# Patient Record
Sex: Male | Born: 2000 | Race: White | Hispanic: No | Marital: Single
Health system: Southern US, Community
[De-identification: ages and names within clinical notes are randomized; demographics above are authoritative.]

---
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
## 2019-08-23 ENCOUNTER — Ambulatory Visit: Payer: Self-pay | Attending: Internal Medicine

## 2019-08-23 DIAGNOSIS — Z23 Encounter for immunization: Secondary | ICD-10-CM

## 2019-08-23 NOTE — Progress Notes (Signed)
   Covid-19 Vaccination Clinic  Name:  Donald Hoffman    MRN: [id number] DOB: Dec 12, 2000  08/23/2019  Donald Hoffman was observed post Covid-19 immunization for 15 minutes without incident. He was provided with Vaccine Information Sheet and instruction to access the V-Safe system.   Donald Hoffman was instructed to call 911 with any severe reactions post vaccine: Marland Kitchen Difficulty breathing  . Swelling of face and throat  . A fast heartbeat  . A bad rash all over body  . Dizziness and weakness   Immunizations Administered    Name Date Dose VIS Date Route   Pfizer COVID-19 Vaccine 08/23/2019  8:48 AM 0.3 mL 05/04/2019 Intramuscular   Manufacturer: [employer], [employer]   Lot: [id number]   NDC: [address]-0

## 2019-09-17 ENCOUNTER — Ambulatory Visit: Payer: Self-pay | Attending: Internal Medicine

## 2019-09-17 DIAGNOSIS — Z23 Encounter for immunization: Secondary | ICD-10-CM

## 2019-09-17 NOTE — Progress Notes (Signed)
   Covid-19 Vaccination Clinic  Name:  ZAVIAN SLOWEY    MRN: [id number] DOB: [birth year]  09/17/2019  Mr. Gobert was observed post Covid-19 immunization for 15 minutes without incident. He was provided with Vaccine Information Sheet and instruction to access the V-Safe system.   Mr. Keirsey was instructed to call 911 with any severe reactions post vaccine: Marland Kitchen Difficulty breathing  . Swelling of face and throat  . A fast heartbeat  . A bad rash all over body  . Dizziness and weakness   Immunizations Administered    Name Date Dose VIS Date Route   Pfizer COVID-19 Vaccine 09/17/2019  8:54 AM 0.3 mL 07/18/2018 Intramuscular   Manufacturer: [employer], [employer]   Lot: [id number]   NDC: [address]-5
